# Patient Record
Sex: Female | Born: 1990 | Race: White | Hispanic: No | Marital: Single | Smoking: Never smoker
Health system: Southern US, Community
[De-identification: ages and names within clinical notes are randomized; demographics above are authoritative.]

## PROBLEM LIST (undated history)

## (undated) ENCOUNTER — Emergency Department: Discharge status: Absent without leave | Payer: Self-pay

## (undated) DIAGNOSIS — N6452 Nipple discharge: Secondary | ICD-10-CM

## (undated) HISTORY — PX: WISDOM TOOTH EXTRACTION: SHX21

---
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
## 2010-06-05 ENCOUNTER — Emergency Department: Payer: Self-pay | Admitting: Emergency Medicine

## 2010-12-28 ENCOUNTER — Emergency Department: Payer: Self-pay

## 2011-01-02 ENCOUNTER — Emergency Department: Payer: Self-pay | Admitting: Emergency Medicine

## 2011-07-22 ENCOUNTER — Emergency Department: Payer: Self-pay | Admitting: Emergency Medicine

## 2011-07-22 LAB — URINALYSIS, COMPLETE
Bilirubin,UR: NEGATIVE
Glucose,UR: NEGATIVE mg/dL (ref 0–75)
Leukocyte Esterase: NEGATIVE
Ph: 5 (ref 4.5–8.0)
Protein: NEGATIVE
RBC,UR: <1 /HPF (ref 0–5)
Specific Gravity: 1.021 (ref 1.003–1.030)
WBC UR: 1 /HPF (ref 0–5)

## 2011-07-27 ENCOUNTER — Emergency Department: Payer: Self-pay | Admitting: Unknown Physician Specialty

## 2011-07-27 LAB — CBC
HCT: 37 % (ref 35.0–47.0)
HGB: 12.4 g/dL (ref 12.0–16.0)
MCH: 30.7 pg (ref 26.0–34.0)
MCHC: 33.4 g/dL (ref 32.0–36.0)
Platelet: 175 10*3/uL (ref 150–440)
RDW: 12.7 % (ref 11.5–14.5)
WBC: 5.1 10*3/uL (ref 3.6–11.0)

## 2012-10-24 ENCOUNTER — Emergency Department: Payer: Self-pay | Admitting: Internal Medicine

## 2015-06-03 ENCOUNTER — Emergency Department: Payer: Managed Care, Other (non HMO)

## 2015-06-03 ENCOUNTER — Emergency Department
Admission: EM | Admit: 2015-06-03 | Discharge: 2015-06-03 | Disposition: A | Discharge status: Home / Self Care | Payer: Managed Care, Other (non HMO) | Attending: Emergency Medicine | Admitting: Emergency Medicine

## 2015-06-03 DIAGNOSIS — T17228A Food in pharynx causing other injury, initial encounter: Secondary | ICD-10-CM | POA: Diagnosis present

## 2015-06-03 DIAGNOSIS — Y929 Unspecified place or not applicable: Secondary | ICD-10-CM | POA: Diagnosis not present

## 2015-06-03 DIAGNOSIS — K2289 Other specified disease of esophagus: Secondary | ICD-10-CM

## 2015-06-03 DIAGNOSIS — X58XXXA Exposure to other specified factors, initial encounter: Secondary | ICD-10-CM | POA: Insufficient documentation

## 2015-06-03 DIAGNOSIS — Y9389 Activity, other specified: Secondary | ICD-10-CM | POA: Insufficient documentation

## 2015-06-03 DIAGNOSIS — K228 Other specified diseases of esophagus: Secondary | ICD-10-CM

## 2015-06-03 DIAGNOSIS — Y999 Unspecified external cause status: Secondary | ICD-10-CM | POA: Insufficient documentation

## 2015-06-03 MED ORDER — LIDOCAINE VISCOUS 2 % MT SOLN
15.0000 mL | Freq: Once | OROMUCOSAL | Status: DC
  Filled 2015-06-03: qty 15

## 2015-06-03 MED ORDER — LIDOCAINE VISCOUS 2 % MT SOLN: 20.0000 mL | OROMUCOSAL | Status: AC | PRN

## 2015-06-03 NOTE — ED Notes (Signed)
Pt was eating potatoes and choked prior to arrival, pt states she feels like she has to cough.  Is able to drink fluids without difficulty, and speak without difficulty but feel like food is stuck. Pt states she was able to swallow potato and continue to eat.

## 2015-06-03 NOTE — ED Provider Notes (Signed)
[facility] Emergency Department Provider Note  ____________________________________________  Time seen: Approximately 8:25 PM  I have reviewed the triage vital signs and the nursing notes.   HISTORY  Chief Complaint Foreign Body    HPI Brittney Lynch is a 25 y.o. female presents for evaluation of having a foreign body sensation in her throat. Patient states that she was eating potatoes proximal thigh 30 minutes prior to arrival and choked and now feels like something stuck in her throat. Patient ports that she is able to breathe and drink fluids without difficulty she speaks but hurts to talk.   No past medical history on file.  There are no active problems to display for this patient.   No past surgical history on file.  Current Outpatient Rx  Name  Route  Sig  Dispense  Refill  . lidocaine (XYLOCAINE) 2 % solution   Mouth/Throat   Use as directed 20 mLs in the mouth or throat as needed for mouth pain.   100 mL   0     Allergies Review of patient's allergies indicates no known allergies.  No family history on file.  Social History Social History  Substance Use Topics  . Smoking status: Not on file  . Smokeless tobacco: Not on file  . Alcohol Use: Not on file    Review of Systems Constitutional: No fever/chills ENT: Positive sore throat with foreign body sensation. Cardiovascular: Denies chest pain. Respiratory: Denies shortness of breath. Musculoskeletal: Negative for back pain. Skin: Negative for rash. Neurological: Negative for headaches, focal weakness or numbness.  10-point ROS otherwise negative.  ____________________________________________   PHYSICAL EXAM:  VITAL SIGNS: ED Triage Vitals  Enc Vitals Group     BP 06/03/15 2006 112/76 mmHg     Pulse Rate 06/03/15 2006 77     Resp 06/03/15 2006 18     Temp 06/03/15 2006 98.3 F (36.8 C)     Temp Source 06/03/15 2006 Oral     SpO2 06/03/15 2006 100 %     Weight  06/03/15 2006 140 lb (63.504 kg)     Height 06/03/15 2006  (1.651 m)     Head Cir --      Peak Flow --      Pain Score --      Pain Loc --      Pain Edu? --      Excl. in GC? --     Constitutional: Alert and oriented. Well appearing and in no acute distress. Head: Atraumatic. Nose: No congestion/rhinnorhea. Mouth/Throat: Mucous membranes are moist.  Oropharynx non-erythematous. Neck: No stridor.   Cardiovascular: Normal rate, regular rhythm. Grossly normal heart sounds.  Good peripheral circulation. Respiratory: Normal respiratory effort.  No retractions. Lungs CTAB. Musculoskeletal: No lower extremity tenderness nor edema.  No joint effusions. Neurologic:  Normal speech and language. No gross focal neurologic deficits are appreciated. No gait instability. Psychiatric: Mood and affect are normal. Speech and behavior are normal.  ____________________________________________   LABS (all labs ordered are listed, but only abnormal results are displayed)  Labs Reviewed - No data to display  RADIOLOGY  FINDINGS: There is no evidence of retropharyngeal soft tissue swelling or epiglottic enlargement. The cervical airway is unremarkable and no radio-opaque foreign body identified.  IMPRESSION: No acute abnormality noted. ____________________________________________   PROCEDURES  Procedure(s) performed: None  Critical Care performed: No  ____________________________________________   INITIAL IMPRESSION / ASSESSMENT AND PLAN / ED COURSE  Pertinent labs & imaging results  that were available during my care of the patient were reviewed by me and considered in my medical decision making (see chart for details).  Acute esophageal irritation. Reassurance provided to the patient. Patient provided with viscous lidocaine 15 mL Devoria GlassingXuan ED discharged home with prescription for same. Follow-up with PCP or return to ER with any worsening symptomology. Patient voices no other  emergency medical complaints at this time. ____________________________________________   FINAL CLINICAL IMPRESSION(S) / ED DIAGNOSES  Final diagnoses:  Esophageal pain     This chart was dictated using voice recognition software/Dragon. Despite best efforts to proofread, errors can occur which can change the meaning. Any change was purely unintentional.   Evangeline Dakinharles M Adrena Nakamura, PA-C 06/03/15 2102  Richardean Canalavid H Yao, MD 06/03/15 [phone]

## 2015-06-03 NOTE — ED Notes (Signed)
Assessed per PA 

## 2015-07-31 ENCOUNTER — Encounter: Payer: Self-pay | Admitting: Emergency Medicine

## 2015-07-31 ENCOUNTER — Emergency Department
Admission: EM | Admit: 2015-07-31 | Discharge: 2015-07-31 | Disposition: A | Discharge status: Home / Self Care | Payer: Managed Care, Other (non HMO) | Attending: Emergency Medicine | Admitting: Emergency Medicine

## 2015-07-31 DIAGNOSIS — J039 Acute tonsillitis, unspecified: Secondary | ICD-10-CM | POA: Diagnosis not present

## 2015-07-31 DIAGNOSIS — J029 Acute pharyngitis, unspecified: Secondary | ICD-10-CM | POA: Diagnosis present

## 2015-07-31 LAB — MONONUCLEOSIS SCREEN: Mono Screen: NEGATIVE

## 2015-07-31 LAB — POCT RAPID STREP A: STREPTOCOCCUS, GROUP A SCREEN (DIRECT): NEGATIVE

## 2015-07-31 MED ORDER — AMOXICILLIN 875 MG PO TABS: 875.0000 mg | ORAL_TABLET | Freq: Two times a day (BID) | ORAL | Status: DC

## 2015-07-31 MED ORDER — FIRST-DUKES MOUTHWASH MT SUSP: 5.0000 mL | Freq: Four times a day (QID) | OROMUCOSAL | Status: AC | PRN

## 2015-07-31 NOTE — Discharge Instructions (Signed)

## 2015-07-31 NOTE — ED Notes (Signed)
Sore throat x 2 days

## 2015-07-31 NOTE — ED Provider Notes (Signed)
[facility] Emergency Department Provider Note  ____________________________________________  Time seen: Approximately 10:14 AM  I have reviewed the triage vital signs and the nursing notes.   HISTORY  Chief Complaint Sore Throat    HPI Brittney Lynch is a 25 y.o. female , NAD, presents to the emergency department, accompanied by her mother, with 2 day history of sore throat. States has had a sore throat for several days that felt like onset of allergy symptoms. Over the last 2 days has had increased and sore throat and has noted some swelling about the right tonsil. Had a low-grade temperature of 58F earlier this morning. Has had some body aches today. Also notes fatigue over the last few days. Has not taken anything over-the-counter for current symptoms. Denies any abdominal pain, nausea, vomiting, ear pain, sinus pressure, nasal congestion, runny nose. No known sick contacts.   History reviewed. No pertinent past medical history.  There are no active problems to display for this patient.   Past Surgical History  Procedure Laterality Date  . Wisdom tooth extraction      Current Outpatient Rx  Name  Route  Sig  Dispense  Refill  . amoxicillin (AMOXIL) 875 MG tablet   Oral   Take 1 tablet (875 mg total) by mouth 2 (two) times daily.   20 tablet   0   . Diphenhyd-Hydrocort-Nystatin (FIRST-DUKES MOUTHWASH) SUSP   Mouth/Throat   Use as directed 5 mLs in the mouth or throat 4 (four) times daily as needed (for throat pain).   120 mL   0   . lidocaine (XYLOCAINE) 2 % solution   Mouth/Throat   Use as directed 20 mLs in the mouth or throat as needed for mouth pain.   100 mL   0     Allergies Review of patient's allergies indicates no known allergies.  No family history on file.  Social History Social History  Substance Use Topics  . Smoking status: Never Smoker   . Smokeless tobacco: None  . Alcohol Use: No     Review of Systems   Constitutional: Positive fatigue. No fever/chills Eyes: No visual changes. No discharge, redness, pain, swelling. ENT: Positive sore throat and swelling about tonsils. No nasal congestion, runny nose, ear pain. Cardiovascular: No chest pain. Respiratory: No cough. No shortness of breath. No wheezing.  Gastrointestinal: No abdominal pain.  No nausea, vomiting.  No diarrhea.  Musculoskeletal: Positive general myalgias.  Skin: Negative for rash. Neurological: Positive lightheadedness this morning that has resolved. Negative for headaches, focal weakness or numbness. No tingling.  10-point ROS otherwise negative.  ____________________________________________   PHYSICAL EXAM:  VITAL SIGNS: ED Triage Vitals  Enc Vitals Group     BP 07/31/15 1004 103/75 mmHg     Pulse Rate 07/31/15 1004 80     Resp 07/31/15 1004 20     Temp 07/31/15 1004 98.2 F (36.8 C)     Temp src --      SpO2 07/31/15 1004 100 %     Weight 07/31/15 1004 145 lb (65.772 kg)     Height 07/31/15 1004  (1.626 m)     Head Cir --      Peak Flow --      Pain Score 07/31/15 1005 8     Pain Loc --      Pain Edu? --      Excl. in GC? --      Constitutional: Alert and oriented. Well appearing and  in no acute distress. Eyes: Conjunctivae are normal.  Head: Atraumatic. ENT:      Ears: TMs visualized bilaterally without effusion, bulging, perforation, erythema.      Nose: No congestion/rhinnorhea.      Mouth/Throat: Right tonsil with mild swelling and white exudate. Trace erythema about the right tonsil. Uvula is midline. Posterior pharynx without swelling or erythema. Mucous membranes are moist.  Neck: Supple with full range of motion. Hematological/Lymphatic/Immunilogical: Positive right, anterior cervical lymphadenopathy. Cardiovascular: Normal rate, regular rhythm. Normal S1 and S2.   Respiratory: Normal respiratory effort without tachypnea or retractions. Lungs CTAB with breath sounds noted in all lung  fields Neurologic:  Normal speech and language. No gross focal neurologic deficits are appreciated.  Skin:  Skin is warm, dry and intact. No rash noted. Psychiatric: Mood and affect are normal. Speech and behavior are normal. Patient exhibits appropriate insight and judgement.   ____________________________________________   LABS (all labs ordered are listed, but only abnormal results are displayed)  Labs Reviewed  CULTURE, GROUP A STREP [facility])  MONONUCLEOSIS SCREEN  POCT RAPID STREP A   ____________________________________________  EKG  None ____________________________________________  RADIOLOGY  None ____________________________________________    PROCEDURES  Procedure(s) performed: None    Medications - No data to display   ____________________________________________   INITIAL IMPRESSION / ASSESSMENT AND PLAN / ED COURSE  Pertinent lab results that were available during my care of the patient were reviewed by me and considered in my medical decision making (see chart for details).  Patient's diagnosis is consistent with tonsillitis. Patient will be discharged home with prescriptions for amoxicillin and magic mouthwash to use as directed. May take Tylenol or Motrin as needed for fever or aches. Patient is to follow up with her PCP if symptoms persist past this treatment course or no better in 2-3 days. Patient is given ED precautions to return to the ED for any worsening or new symptoms.    ____________________________________________  FINAL CLINICAL IMPRESSION(S) / ED DIAGNOSES  Final diagnoses:  Tonsillitis      NEW MEDICATIONS STARTED DURING THIS VISIT:  New Prescriptions   AMOXICILLIN (AMOXIL) 875 MG TABLET    Take 1 tablet (875 mg total) by mouth 2 (two) times daily.   DIPHENHYD-HYDROCORT-NYSTATIN (FIRST-DUKES MOUTHWASH) SUSP    Use as directed 5 mLs in the mouth or throat 4 (four) times daily as needed (for throat pain).         Hope PigeonJami L  Sharee Sturdy, PA-C 07/31/15 1126  Sharyn CreamerMark Quale, MD 07/31/15 1525

## 2015-08-02 LAB — CULTURE, GROUP A STREP (THRC)

## 2017-09-26 ENCOUNTER — Other Ambulatory Visit: Payer: Self-pay | Admitting: Internal Medicine

## 2017-10-03 ENCOUNTER — Other Ambulatory Visit: Payer: Self-pay | Admitting: Internal Medicine

## 2017-10-03 DIAGNOSIS — N643 Galactorrhea not associated with childbirth: Secondary | ICD-10-CM

## 2017-10-10 ENCOUNTER — Ambulatory Visit
Admission: RE | Admit: 2017-10-10 | Discharge: 2017-10-10 | Disposition: A | Discharge status: Home / Self Care | Payer: Managed Care, Other (non HMO) | Source: Ambulatory Visit | Attending: Internal Medicine | Admitting: Internal Medicine

## 2017-10-10 DIAGNOSIS — N643 Galactorrhea not associated with childbirth: Secondary | ICD-10-CM | POA: Insufficient documentation

## 2017-10-10 HISTORY — DX: Nipple discharge: N64.52

## 2018-09-22 ENCOUNTER — Telehealth: Payer: Self-pay

## 2018-09-22 DIAGNOSIS — Z20822 Contact with and (suspected) exposure to covid-19: Secondary | ICD-10-CM

## 2018-09-22 NOTE — Telephone Encounter (Signed)
Pt potential work exposure. Pt denies symptoms but wants to be screened. Order placed for testing. Address given for [facility] testing site. Advised pt to stay in car and to wear mask to testing location. Pt verbalized understanding.

## 2018-09-23 ENCOUNTER — Other Ambulatory Visit: Payer: Self-pay

## 2018-09-23 DIAGNOSIS — Z20822 Contact with and (suspected) exposure to covid-19: Secondary | ICD-10-CM

## 2018-09-24 LAB — NOVEL CORONAVIRUS, NAA: SARS-CoV-2, NAA: NOT DETECTED

## 2018-09-25 ENCOUNTER — Telehealth: Payer: Self-pay | Admitting: General Practice

## 2018-09-25 NOTE — Telephone Encounter (Signed)
Negative COVID results given. Patient results "NOT Detected." Caller expressed understanding. ° °

## 2019-03-25 ENCOUNTER — Ambulatory Visit: Payer: BC Managed Care – PPO | Attending: Internal Medicine

## 2019-03-25 DIAGNOSIS — Z20822 Contact with and (suspected) exposure to covid-19: Secondary | ICD-10-CM | POA: Insufficient documentation

## 2019-03-26 LAB — NOVEL CORONAVIRUS, NAA: SARS-CoV-2, NAA: NOT DETECTED

## 2019-10-23 ENCOUNTER — Other Ambulatory Visit: Payer: Self-pay | Admitting: Internal Medicine

## 2019-10-23 DIAGNOSIS — N6310 Unspecified lump in the right breast, unspecified quadrant: Secondary | ICD-10-CM

## 2019-11-05 ENCOUNTER — Ambulatory Visit
Admission: RE | Admit: 2019-11-05 | Discharge: 2019-11-05 | Disposition: A | Discharge status: Home / Self Care | Payer: BC Managed Care – PPO | Source: Ambulatory Visit | Attending: Internal Medicine | Admitting: Internal Medicine

## 2019-11-05 ENCOUNTER — Other Ambulatory Visit: Payer: Self-pay

## 2019-11-05 DIAGNOSIS — N6310 Unspecified lump in the right breast, unspecified quadrant: Secondary | ICD-10-CM | POA: Diagnosis not present

## 2020-02-14 ENCOUNTER — Other Ambulatory Visit: Payer: Self-pay

## 2020-02-14 ENCOUNTER — Emergency Department
Admission: EM | Admit: 2020-02-14 | Discharge: 2020-02-14 | Disposition: A | Discharge status: Home / Self Care | Payer: BC Managed Care – PPO | Attending: Emergency Medicine | Admitting: Emergency Medicine

## 2020-02-14 DIAGNOSIS — T3695XA Adverse effect of unspecified systemic antibiotic, initial encounter: Secondary | ICD-10-CM | POA: Insufficient documentation

## 2020-02-14 DIAGNOSIS — R110 Nausea: Secondary | ICD-10-CM | POA: Diagnosis not present

## 2020-02-14 DIAGNOSIS — R1013 Epigastric pain: Secondary | ICD-10-CM | POA: Insufficient documentation

## 2020-02-14 DIAGNOSIS — T50905A Adverse effect of unspecified drugs, medicaments and biological substances, initial encounter: Secondary | ICD-10-CM

## 2020-02-14 DIAGNOSIS — N3000 Acute cystitis without hematuria: Secondary | ICD-10-CM | POA: Diagnosis not present

## 2020-02-14 LAB — COMPREHENSIVE METABOLIC PANEL
ALT: 18 U/L (ref 0–44)
AST: 21 U/L (ref 15–41)
Albumin: 3.9 g/dL (ref 3.5–5.0)
Alkaline Phosphatase: 45 U/L (ref 38–126)
Anion gap: 9 (ref 5–15)
BUN: 13 mg/dL (ref 6–20)
CO2: 25 mmol/L (ref 22–32)
Calcium: 9 mg/dL (ref 8.9–10.3)
Chloride: 104 mmol/L (ref 98–111)
Creatinine, Ser: 0.43 mg/dL — ABNORMAL LOW (ref 0.44–1.00)
GFR, Estimated: >60 mL/min (ref >60)
Glucose, Bld: 110 mg/dL — ABNORMAL HIGH (ref 70–99)
Potassium: 3.7 mmol/L (ref 3.5–5.1)
Sodium: 138 mmol/L (ref 135–145)
Total Bilirubin: 0.7 mg/dL (ref 0.3–1.2)
Total Protein: 7.2 g/dL (ref 6.5–8.1)

## 2020-02-14 LAB — CBC
HCT: 39.4 % (ref 36.0–46.0)
Hemoglobin: 13.4 g/dL (ref 12.0–15.0)
MCH: 31.5 pg (ref 26.0–34.0)
MCHC: 34 g/dL (ref 30.0–36.0)
MCV: 92.5 fL (ref 80.0–100.0)
Platelets: 249 10*3/uL (ref 150–400)
RBC: 4.26 MIL/uL (ref 3.87–5.11)
RDW: 12.6 % (ref 11.5–15.5)
WBC: 5.7 10*3/uL (ref 4.0–10.5)
nRBC: 0 % (ref 0.0–0.2)

## 2020-02-14 LAB — URINALYSIS, COMPLETE (UACMP) WITH MICROSCOPIC
Bacteria, UA: NONE SEEN
Bilirubin Urine: NEGATIVE
Glucose, UA: NEGATIVE mg/dL
Hgb urine dipstick: NEGATIVE
Ketones, ur: NEGATIVE mg/dL
Leukocytes,Ua: NEGATIVE
Nitrite: NEGATIVE
Protein, ur: NEGATIVE mg/dL
Specific Gravity, Urine: 1.018 (ref 1.005–1.030)
pH: 5 (ref 5.0–8.0)

## 2020-02-14 LAB — POC URINE PREG, ED: Preg Test, Ur: NEGATIVE

## 2020-02-14 LAB — LIPASE, BLOOD: Lipase: 30 U/L (ref 11–51)

## 2020-02-14 MED ORDER — AMOXICILLIN 875 MG PO TABS: 875.0000 mg | ORAL_TABLET | Freq: Two times a day (BID) | ORAL | 0 refills | Status: AC

## 2020-02-14 NOTE — ED Notes (Signed)
Pt states she has had UTI's before but it has been "a really long time, years"

## 2020-02-14 NOTE — ED Triage Notes (Signed)
Pt comes pov with upper abdominal pain. Started antibiotic for UTI yesterday (2 doses). Sharp pain radiating to the back.

## 2020-02-14 NOTE — Discharge Instructions (Signed)
Please stop taking your cefdinir and start taking amoxicillin

## 2020-02-14 NOTE — ED Provider Notes (Signed)
[facility] Emergency Department Provider Note   ____________________________________________   Event Date/Time   First MD Initiated Contact with Patient 02/14/20 1110     (approximate)  I have reviewed the triage vital signs and the nursing notes.   HISTORY  Chief Complaint Abdominal Pain    HPI Brittney Lynch is a 30 y.o. female with recently diagnosed urinary tract infection who presents for midepigastric abdominal pain that radiates through to her back and is worsened when taking her cefdinir that she was prescribed for her urinary tract infection.  Patient also describes associated nausea.  Patient denies any other exacerbating or relieving factors.  Patient describes a burning, 5/10 central abdominal pain that radiates through to her back.  Patient has not tried taking any medications for this pain.  Patient currently denies any vision changes, tinnitus, difficulty speaking, facial droop, sore throat, shortness of breath, nausea/vomiting/diarrhea, dysuria, or weakness/numbness/paresthesias in any extremity         Past Medical History:  Diagnosis Date  . Breast discharge     There are no problems to display for this patient.   Past Surgical History:  Procedure Laterality Date  . WISDOM TOOTH EXTRACTION      Prior to Admission medications   Medication Sig Start Date End Date Taking? Authorizing Provider  amoxicillin (AMOXIL) 875 MG tablet Take 1 tablet (875 mg total) by mouth 2 (two) times daily. 02/14/20   Merwyn Katos, MD  Diphenhyd-Hydrocort-Nystatin (FIRST-DUKES MOUTHWASH) SUSP Use as directed 5 mLs in the mouth or throat 4 (four) times daily as needed (for throat pain). 07/31/15   Hagler, Jami L, PA-C  lidocaine (XYLOCAINE) 2 % solution Use as directed 20 mLs in the mouth or throat as needed for mouth pain. 06/03/15   Beers, Charmayne Sheer, PA-C    Allergies Patient has no known allergies.  Family History  Problem Relation Age of  Onset  . Breast cancer Mother     Social History Social History   Tobacco Use  . Smoking status: Never Smoker  Substance Use Topics  . Alcohol use: No    Review of Systems Constitutional: No fever/chills Eyes: No visual changes. ENT: No sore throat. Cardiovascular: Denies chest pain. Respiratory: Denies shortness of breath. Gastrointestinal: Endorses abdominal pain.  Endorses nausea, no vomiting.  No diarrhea. Genitourinary: Negative for dysuria. Musculoskeletal: Negative for acute arthralgias Skin: Negative for rash. Neurological: Negative for headaches, weakness/numbness/paresthesias in any extremity Psychiatric: Negative for suicidal ideation/homicidal ideation   ____________________________________________   PHYSICAL EXAM:  VITAL SIGNS: ED Triage Vitals [02/14/20 0819]  Enc Vitals Group     BP 111/80     Pulse Rate 82     Resp 18     Temp 98.6 F (37 C)     Temp Source Oral     SpO2 100 %     Weight 160 lb (72.6 kg)     Height 5\' 4"  (1.626 m)     Head Circumference      Peak Flow      Pain Score 5     Pain Loc      Pain Edu?      Excl. in GC?    Constitutional: Alert and oriented. Well appearing and in no acute distress. Eyes: Conjunctivae are normal. PERRL. Head: Atraumatic. Nose: No congestion/rhinnorhea. Mouth/Throat: Mucous membranes are moist. Neck: No stridor Cardiovascular: Grossly normal heart sounds.  Good peripheral circulation. Respiratory: Normal respiratory effort.  No retractions. Gastrointestinal: Soft and nontender.  No distention. Musculoskeletal: No obvious deformities Neurologic:  Normal speech and language. No gross focal neurologic deficits are appreciated. Skin:  Skin is warm and dry. No rash noted. Psychiatric: Mood and affect are normal. Speech and behavior are normal.  ____________________________________________   LABS (all labs ordered are listed, but only abnormal results are displayed)  Labs Reviewed  COMPREHENSIVE  METABOLIC PANEL - Abnormal; Notable for the following components:      Result Value   Glucose, Bld 110 (*)    Creatinine, Ser 0.43 (*)    All other components within normal limits  URINALYSIS, COMPLETE (UACMP) WITH MICROSCOPIC - Abnormal; Notable for the following components:   Color, Urine YELLOW (*)    APPearance CLEAR (*)    All other components within normal limits  LIPASE, BLOOD  CBC  POC URINE PREG, ED   ____________________________________________  EKG  ED ECG REPORT I, Merwyn Katos, the attending physician, personally viewed and interpreted this ECG.  Date: 02/14/2020 EKG Time: 0822 Rate: 72 Rhythm: normal sinus rhythm QRS Axis: normal Intervals: normal ST/T Wave abnormalities: normal Narrative Interpretation: no evidence of acute ischemia   PROCEDURES  Procedure(s) performed (including Critical Care):  .1-3 Lead EKG Interpretation Performed by: Merwyn Katos, MD Authorized by: Merwyn Katos, MD     Interpretation: normal     ECG rate:  68   ECG rate assessment: normal     Rhythm: sinus rhythm     Ectopy: none     Conduction: normal       ____________________________________________   INITIAL IMPRESSION / ASSESSMENT AND PLAN / ED COURSE  As part of my medical decision making, I reviewed the following data within the electronic MEDICAL RECORD NUMBER Nursing notes reviewed and incorporated, Labs reviewed, EKG interpreted, Old chart reviewed and Notes from prior ED visits reviewed and incorporated        Patient is a 30 year old female that presents for midepigastric abdominal pain after beginning her cefdinir for recently diagnosed urinary tract infection.  Patient's lipase and transaminases as well as bilirubin normal and therefore low probability of patient having pancreatitis or other gallbladder related diseases.  Patient's vital signs stable and pain well controlled prior to discharge.  Patient was counseled on the importance of likely needing to  change her antibiotic therapy.  Patient requested amoxicillin as she states that this is worked for her in the past for urinary tract infections and has not caused any abdominal pain.  Patient was given prescription for amoxicillin and all questions were answered prior to discharge  Clinical Course as of 02/14/20 1545  Sat Feb 14, 2020  1126 Lipase: 30 [EB]    Clinical Course User Index [EB] Merwyn Katos, MD     ____________________________________________   FINAL CLINICAL IMPRESSION(S) / ED DIAGNOSES  Final diagnoses:  Medication adverse effect, initial encounter  Acute cystitis without hematuria     ED Discharge Orders         Ordered    amoxicillin (AMOXIL) 875 MG tablet  2 times daily        02/14/20 1133           Note:  This document was prepared using Dragon voice recognition software and may include unintentional dictation errors.   Merwyn Katos, MD 02/14/20 [phone]

## 2020-02-14 NOTE — ED Notes (Signed)
Pt reports on Thursday she began to have symptoms suspicious for a UTI (lower back pain, urinary discomfort), was seen at a walk in clinic on Friday. Diagnoses with UTI, prescribed Cefdinir, took first dose yesterday at approx 1400.   Pt reports at approx 0630 this AM she awakened to 5/10 central abd pain radiating to her lower back, right side slightly more than left. Pt describes pain as constant, mild relief when sitting vs standing. Has not taken anything for it so far, did not take AM dose of abx.   Pt describes pain as "constant ache, not sharp or stabbing." Some increase with bending/stretching.   Pt denies n/v/d, fevers/chills, rashes, changes to breathing, headaches

## 2020-10-26 ENCOUNTER — Emergency Department: Payer: BC Managed Care – PPO

## 2020-10-26 ENCOUNTER — Emergency Department
Admission: EM | Admit: 2020-10-26 | Discharge: 2020-10-26 | Disposition: A | Discharge status: Home / Self Care | Payer: BC Managed Care – PPO | Attending: Emergency Medicine | Admitting: Emergency Medicine

## 2020-10-26 DIAGNOSIS — M549 Dorsalgia, unspecified: Secondary | ICD-10-CM | POA: Diagnosis not present

## 2020-10-26 DIAGNOSIS — K429 Umbilical hernia without obstruction or gangrene: Secondary | ICD-10-CM | POA: Insufficient documentation

## 2020-10-26 DIAGNOSIS — R109 Unspecified abdominal pain: Secondary | ICD-10-CM | POA: Diagnosis present

## 2020-10-26 LAB — LIPASE, BLOOD: Lipase: 41 U/L (ref 11–51)

## 2020-10-26 LAB — CBC
HCT: 39.2 % (ref 36.0–46.0)
Hemoglobin: 13.7 g/dL (ref 12.0–15.0)
MCH: 32.2 pg (ref 26.0–34.0)
MCHC: 34.9 g/dL (ref 30.0–36.0)
MCV: 92 fL (ref 80.0–100.0)
Platelets: 285 10*3/uL (ref 150–400)
RBC: 4.26 MIL/uL (ref 3.87–5.11)
RDW: 12.5 % (ref 11.5–15.5)
WBC: 8 10*3/uL (ref 4.0–10.5)
nRBC: 0 % (ref 0.0–0.2)

## 2020-10-26 LAB — URINALYSIS, COMPLETE (UACMP) WITH MICROSCOPIC
Bilirubin Urine: NEGATIVE
Glucose, UA: NEGATIVE mg/dL
Ketones, ur: NEGATIVE mg/dL
Leukocytes,Ua: NEGATIVE
Nitrite: NEGATIVE
Protein, ur: NEGATIVE mg/dL
Specific Gravity, Urine: 1.002 — ABNORMAL LOW (ref 1.005–1.030)
pH: 7 (ref 5.0–8.0)

## 2020-10-26 LAB — COMPREHENSIVE METABOLIC PANEL
ALT: 22 U/L (ref 0–44)
AST: 30 U/L (ref 15–41)
Albumin: 4.4 g/dL (ref 3.5–5.0)
Alkaline Phosphatase: 52 U/L (ref 38–126)
Anion gap: 8 (ref 5–15)
BUN: 14 mg/dL (ref 6–20)
CO2: 26 mmol/L (ref 22–32)
Calcium: 9.4 mg/dL (ref 8.9–10.3)
Chloride: 103 mmol/L (ref 98–111)
Creatinine, Ser: 0.66 mg/dL (ref 0.44–1.00)
GFR, Estimated: >60 mL/min (ref >60)
Glucose, Bld: 97 mg/dL (ref 70–99)
Potassium: 3.5 mmol/L (ref 3.5–5.1)
Sodium: 137 mmol/L (ref 135–145)
Total Bilirubin: 0.6 mg/dL (ref 0.3–1.2)
Total Protein: 7.4 g/dL (ref 6.5–8.1)

## 2020-10-26 MED ORDER — CEPHALEXIN 500 MG PO CAPS: 500.0000 mg | ORAL_CAPSULE | Freq: Three times a day (TID) | ORAL | 0 refills | Status: AC

## 2020-10-26 NOTE — ED Provider Notes (Signed)
[facility] Emergency Department Provider Note  Time seen: 7:02 PM  I have reviewed the triage vital signs and the nursing notes.   HISTORY  Chief Complaint Abdominal Pain (X 1 week , left of navel constant pain )   HPI Brittney Lynch is a 30 y.o. female with no significant past medical history who presents to the emergency department for left-sided abdominal pain.  According to the patient over the past week she has been experiencing intermittent pain in the left abdomen and occasionally in the left back.  Patient denies any pain with movement.  Denies any pain with palpation.  Denies any dysuria or hematuria, vaginal bleeding or discharge.  Denies any nausea vomiting or diarrhea.  No fever.  No history of kidney stones.   Past Medical History:  Diagnosis Date   Breast discharge     There are no problems to display for this patient.   Past Surgical History:  Procedure Laterality Date   WISDOM TOOTH EXTRACTION      Prior to Admission medications   Medication Sig Start Date End Date Taking? Authorizing Provider  amoxicillin (AMOXIL) 875 MG tablet Take 1 tablet (875 mg total) by mouth 2 (two) times daily. 02/14/20   Merwyn Katos, MD  Diphenhyd-Hydrocort-Nystatin (FIRST-DUKES MOUTHWASH) SUSP Use as directed 5 mLs in the mouth or throat 4 (four) times daily as needed (for throat pain). 07/31/15   Hagler, Jami L, PA-C  lidocaine (XYLOCAINE) 2 % solution Use as directed 20 mLs in the mouth or throat as needed for mouth pain. 06/03/15   Beers, Charmayne Sheer, PA-C    No Known Allergies  Family History  Problem Relation Age of Onset   Breast cancer Mother     Social History Social History   Tobacco Use   Smoking status: Never  Substance Use Topics   Alcohol use: No    Review of Systems Constitutional: Negative for fever. Cardiovascular: Negative for chest pain. Respiratory: Negative for shortness of breath. Gastrointestinal: Moderate left flank  pain intermittent, sharp.  Negative for nausea vomiting or diarrhea Genitourinary: Negative for urinary compaints Musculoskeletal: Negative for musculoskeletal complaints Neurological: Negative for headache All other ROS negative  ____________________________________________   PHYSICAL EXAM:  VITAL SIGNS: ED Triage Vitals [10/26/20 1754]  Enc Vitals Group     BP 125/79     Pulse Rate 79     Resp 17     Temp 98.5 F (36.9 C)     Temp Source Oral     SpO2 95 %     Weight 165 lb 5.5 oz (75 kg)     Height 5\' 7"  (1.702 m)     Head Circumference      Peak Flow      Pain Score 2     Pain Loc      Pain Edu?      Excl. in GC?    Constitutional: Alert and oriented. Well appearing and in no distress. Eyes: Normal exam ENT      Head: Normocephalic and atraumatic.      Mouth/Throat: Mucous membranes are moist. Cardiovascular: Normal rate, regular rhythm.  Respiratory: Normal respiratory effort without tachypnea nor retractions. Breath sounds are clear  Gastrointestinal: Soft and nontender. No distention.  Musculoskeletal: Nontender with normal range of motion in all extremities. Neurologic:  Normal speech and language. No gross focal neurologic deficits  Skin:  Skin is warm, dry and intact.  Psychiatric: Mood and affect are normal.  ____________________________________________   RADIOLOGY  CT scan shows a very small umbilical hernia with a mild amount of cellulitis.  ____________________________________________   INITIAL IMPRESSION / ASSESSMENT AND PLAN / ED COURSE  Pertinent labs & imaging results that were available during my care of the patient were reviewed by me and considered in my medical decision making (see chart for details).   Patient presents emergency department for left flank pain intermittent over the past week or so and now with pain intermittently to the left back.  Patient's work-up is reassuring.  Benign abdominal exam.  No CVA tenderness.  Lab work is  reassuring.  Urinalysis is normal.  However given the patient's left flank pain intermittently now with back pain we will obtain CT renal scan to rule out ureterolithiasis.  Differential would also include intestinal type pains, musculoskeletal pains, colitis or diverticulitis.  CT concerning for very small umbilical hernia with a small amount of cellulitis otherwise unremarkable.  We will cover with antibiotics as a precaution.  We will have the patient follow-up with general surgery if she continues to have pain over the next 1 week.  Does not appear to be causing any bowel or intestinal issues.  Brittney Lynch was evaluated in Emergency Department on 10/26/2020 for the symptoms described in the history of present illness. She was evaluated in the context of the global COVID-19 pandemic, which necessitated consideration that the patient might be at risk for infection with the SARS-CoV-2 virus that causes COVID-19. Institutional protocols and algorithms that pertain to the evaluation of patients at risk for COVID-19 are in a state of rapid change based on information released by regulatory bodies including the CDC and federal and state organizations. These policies and algorithms were followed during the patient's care in the ED.  ____________________________________________   FINAL CLINICAL IMPRESSION(S) / ED DIAGNOSES  Umbilical hernia   [name], MD 10/26/20 2013

## 2020-10-26 NOTE — ED Triage Notes (Signed)
X 1 week , left of navel constant pain, no nausea, no vomiting no other complaints

## 2021-05-11 ENCOUNTER — Other Ambulatory Visit: Payer: Self-pay | Admitting: Family Medicine

## 2021-05-11 DIAGNOSIS — N644 Mastodynia: Secondary | ICD-10-CM

## 2021-05-11 DIAGNOSIS — M7989 Other specified soft tissue disorders: Secondary | ICD-10-CM

## 2021-05-11 DIAGNOSIS — M79622 Pain in left upper arm: Secondary | ICD-10-CM

## 2021-05-30 ENCOUNTER — Ambulatory Visit
Admission: RE | Admit: 2021-05-30 | Discharge: 2021-05-30 | Disposition: A | Discharge status: Home / Self Care | Payer: BC Managed Care – PPO | Source: Ambulatory Visit | Attending: Family Medicine | Admitting: Family Medicine

## 2021-05-30 DIAGNOSIS — M79622 Pain in left upper arm: Secondary | ICD-10-CM

## 2021-05-30 DIAGNOSIS — M7989 Other specified soft tissue disorders: Secondary | ICD-10-CM | POA: Insufficient documentation

## 2021-05-30 DIAGNOSIS — N644 Mastodynia: Secondary | ICD-10-CM

## 2021-06-01 ENCOUNTER — Other Ambulatory Visit: Payer: Self-pay | Admitting: Family Medicine

## 2021-06-01 DIAGNOSIS — N63 Unspecified lump in unspecified breast: Secondary | ICD-10-CM

## 2021-06-01 DIAGNOSIS — N6001 Solitary cyst of right breast: Secondary | ICD-10-CM

## 2021-06-01 DIAGNOSIS — R928 Other abnormal and inconclusive findings on diagnostic imaging of breast: Secondary | ICD-10-CM

## 2021-06-21 ENCOUNTER — Ambulatory Visit
Admission: RE | Admit: 2021-06-21 | Discharge: 2021-06-21 | Disposition: A | Discharge status: Home / Self Care | Payer: BC Managed Care – PPO | Source: Ambulatory Visit | Attending: Family Medicine | Admitting: Family Medicine

## 2021-06-21 ENCOUNTER — Other Ambulatory Visit: Payer: Self-pay | Admitting: Family Medicine

## 2021-06-21 DIAGNOSIS — N63 Unspecified lump in unspecified breast: Secondary | ICD-10-CM

## 2021-06-21 DIAGNOSIS — N6001 Solitary cyst of right breast: Secondary | ICD-10-CM | POA: Insufficient documentation

## 2021-06-21 DIAGNOSIS — R928 Other abnormal and inconclusive findings on diagnostic imaging of breast: Secondary | ICD-10-CM | POA: Insufficient documentation

## 2021-06-21 HISTORY — PX: BREAST BIOPSY: SHX20

## 2021-06-22 LAB — SURGICAL PATHOLOGY

## 2021-06-26 IMAGING — US US BREAST*R* LIMITED INC AXILLA
1 series · 10 of 10 positions shown · non-contrast
Comparison: None.

CLINICAL DATA: 29-year-old female with a palpable right breast
lump.

EXAM:
ULTRASOUND OF THE RIGHT BREAST

[Series 1: us breast*right* limited inc axilla · 0.07mm/px · 10 of 10 slices shown]
[im 1/10]
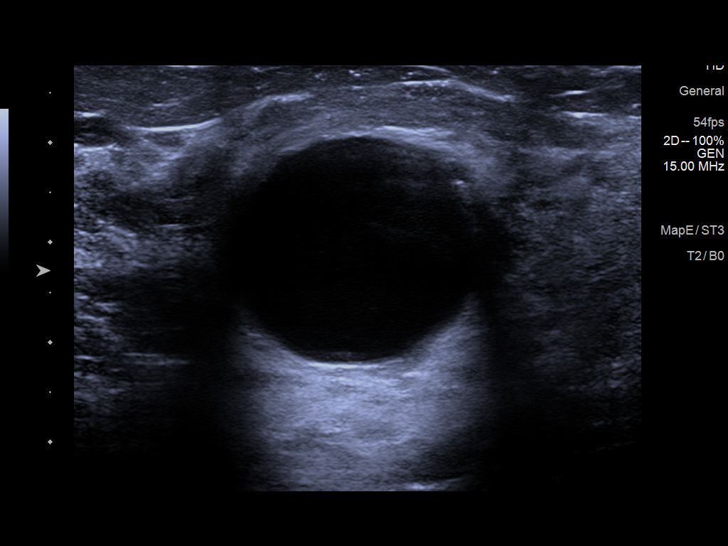
[im 2/10]
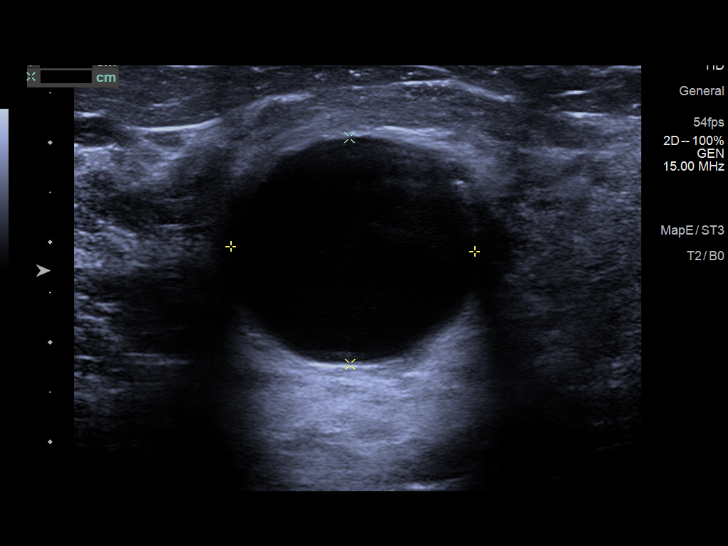
[im 3/10]
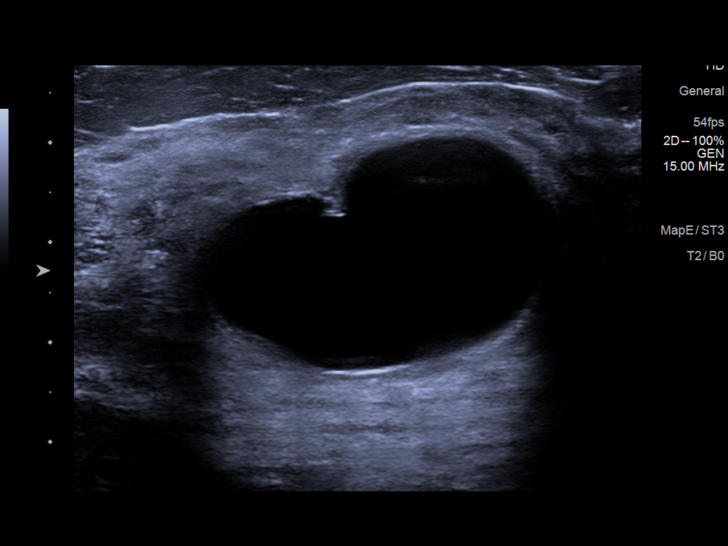
[im 4/10]
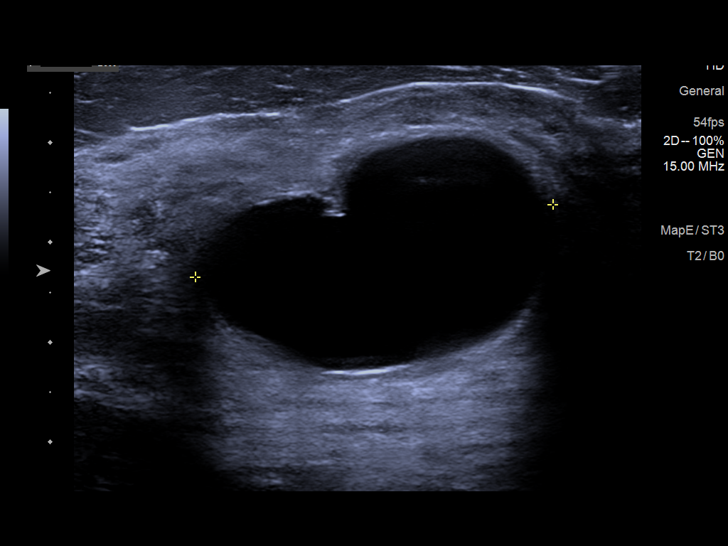
[im 5/10]
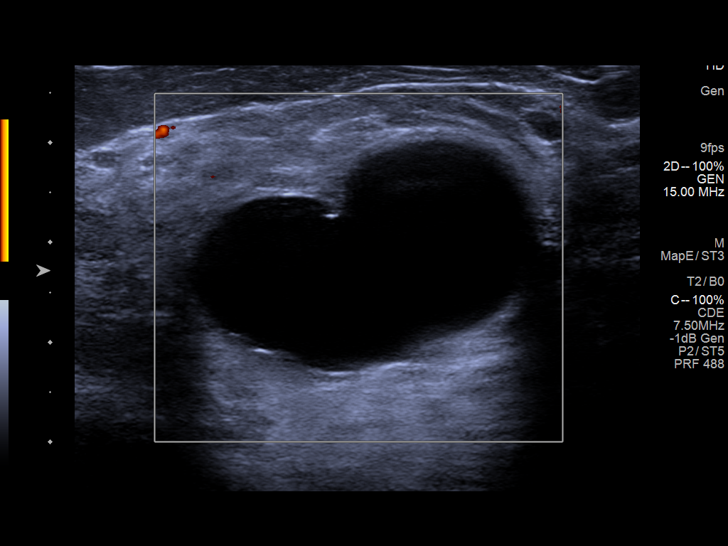
[im 6/10]
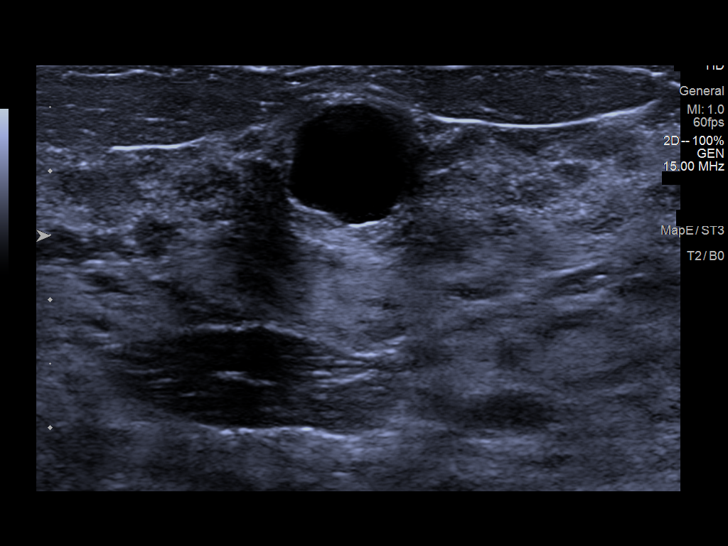
[im 7/10]
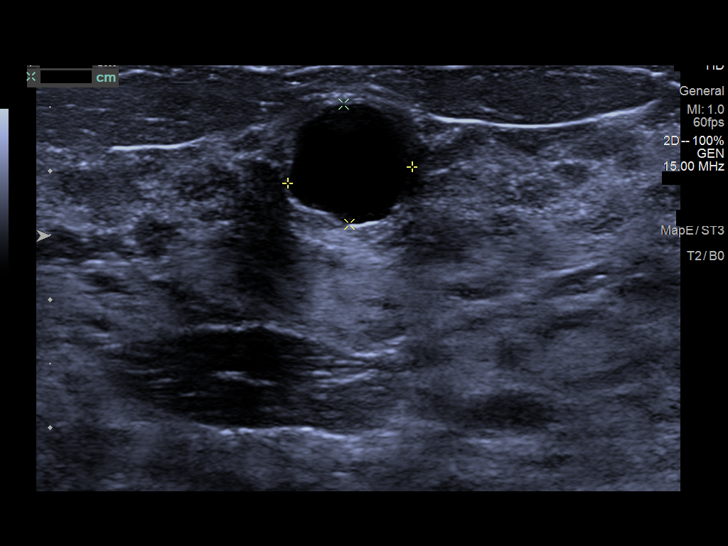
[im 8/10]
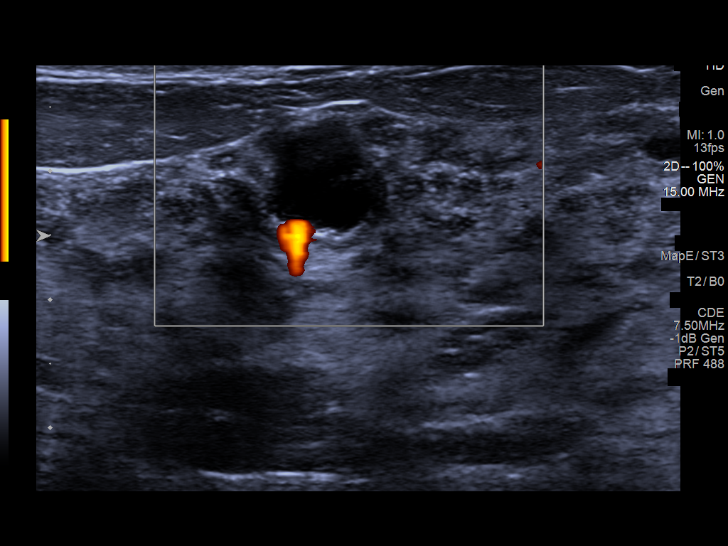
[im 9/10]
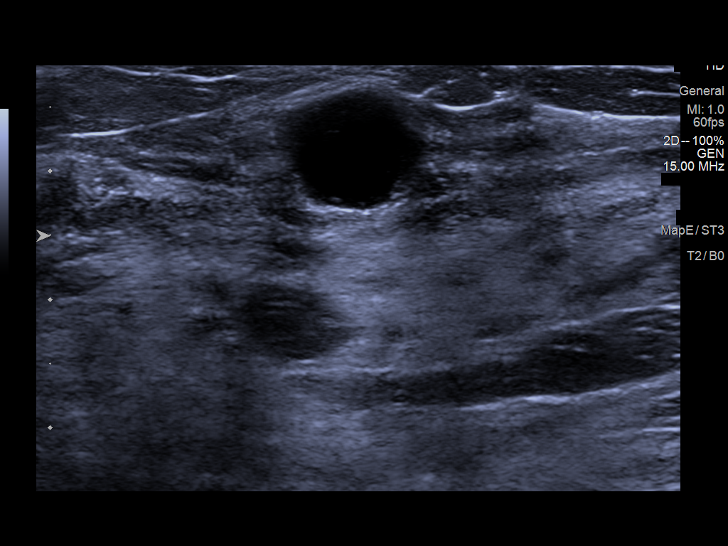
[im 10/10]
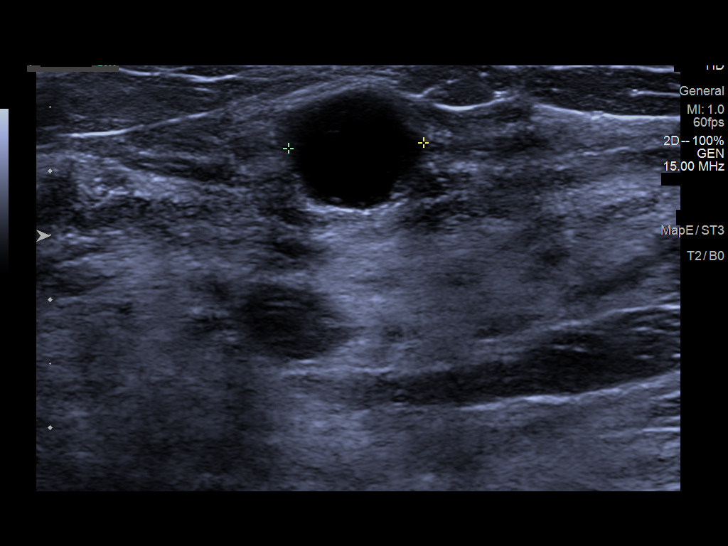

[10 of 10 positions shown; findings below may reference images not displayed]

FINDINGS: On physical exam, I palpate a smooth, mobile 2-3 cm mass in the
upper right breast.

Targeted ultrasound is performed, showing an oval, circumscribed
anechoic mass at the 12 o'clock position 4 cm from the nipple. It
demonstrates posterior acoustic enhancement and no internal
vascularity. It measures 3.7 x 2.4 x 2.3 cm. This correlates with
the patient's palpable lump and is consistent with a benign simple
cyst.

A similar appearing simple cyst is noted at the 11 o'clock position
2 cm from the nipple. It measures 11 x 10 x 9 mm. Additional
smaller, similar appearing cysts are scattered throughout the right
breast.
IMPRESSION: Benign simple cyst corresponding with the patient's right breast
palpable lump. No further imaging follow-up required.

RECOMMENDATION:
Screening mammogram at age 40 unless there are persistent or
intervening clinical concerns. (Code:[id number])

I have discussed the findings and recommendations with the patient.
If applicable, a reminder letter will be sent to the patient
regarding the next appointment.

BI-RADS CATEGORY  2: Benign.

## 2023-02-10 IMAGING — MG MM BREAST LOCALIZATION CLIP
4 series · 4 of 12 positions shown · non-contrast
Comparison: Previous exam(s).

CLINICAL DATA: Status post ultrasound-guided core biopsy
intracystic nodule right breast.

EXAM:
3D DIAGNOSTIC RIGHT MAMMOGRAM POST ULTRASOUND BIOPSY

[R CC synth-2D]
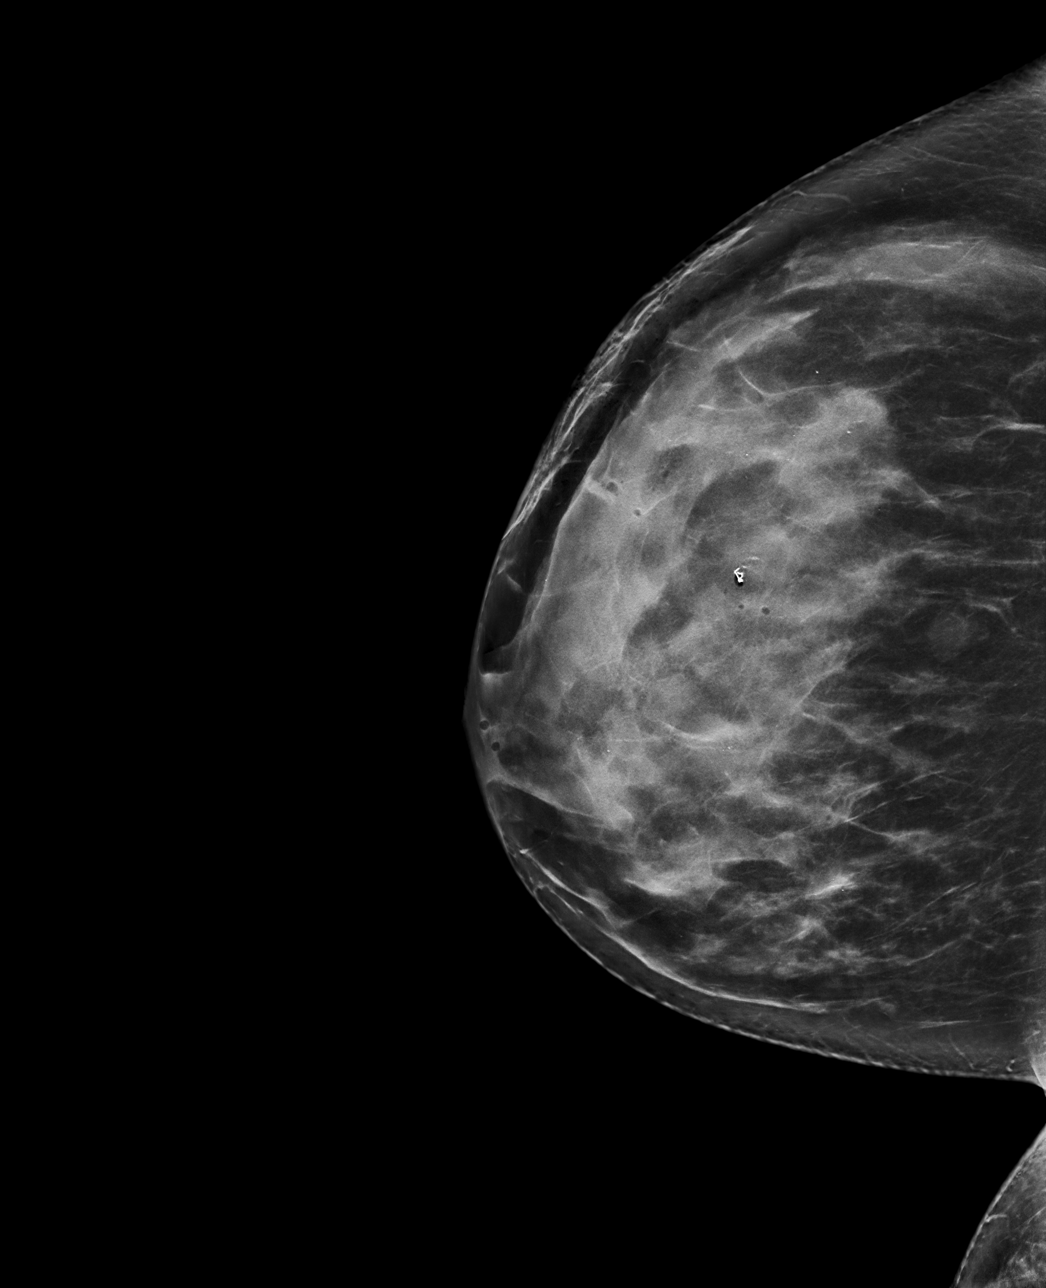

[R ML synth-2D]
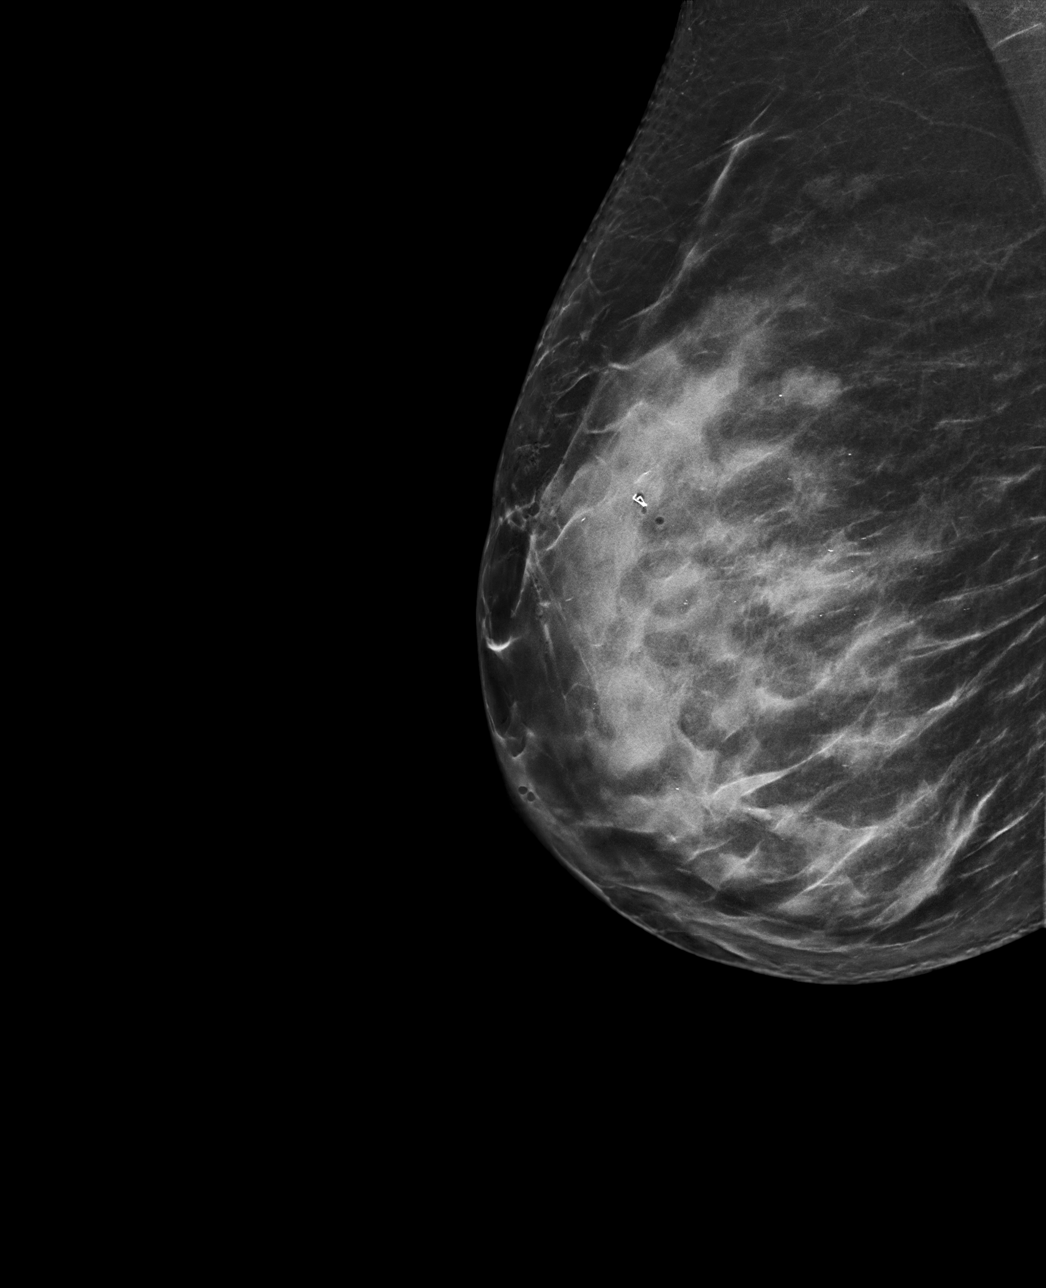

[R ML tomo · tomo slice 41/82.0]
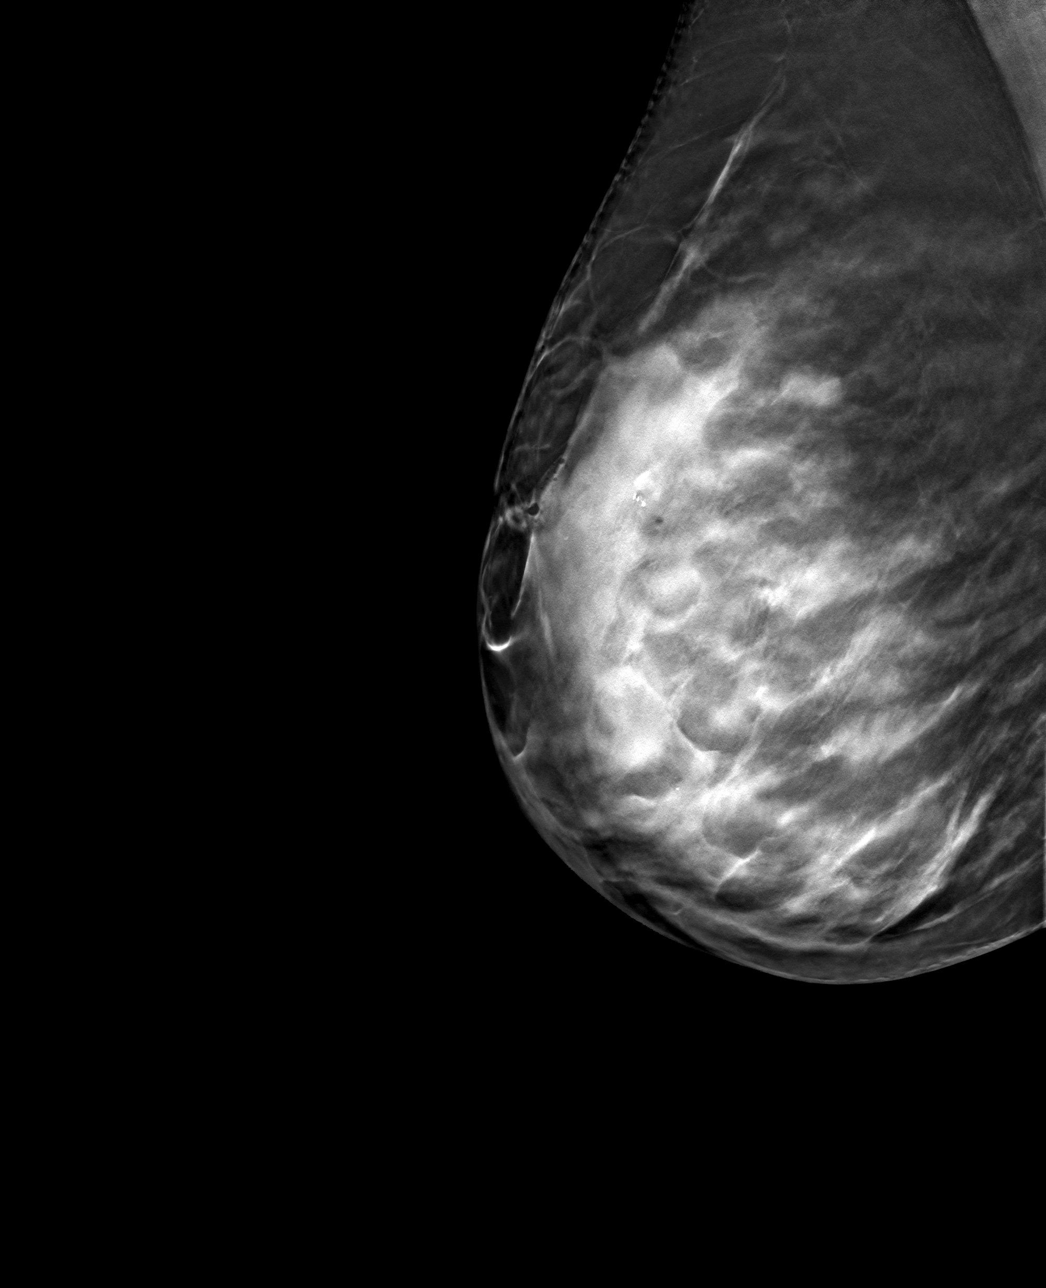

[R CC tomo · tomo slice 43/84.0]
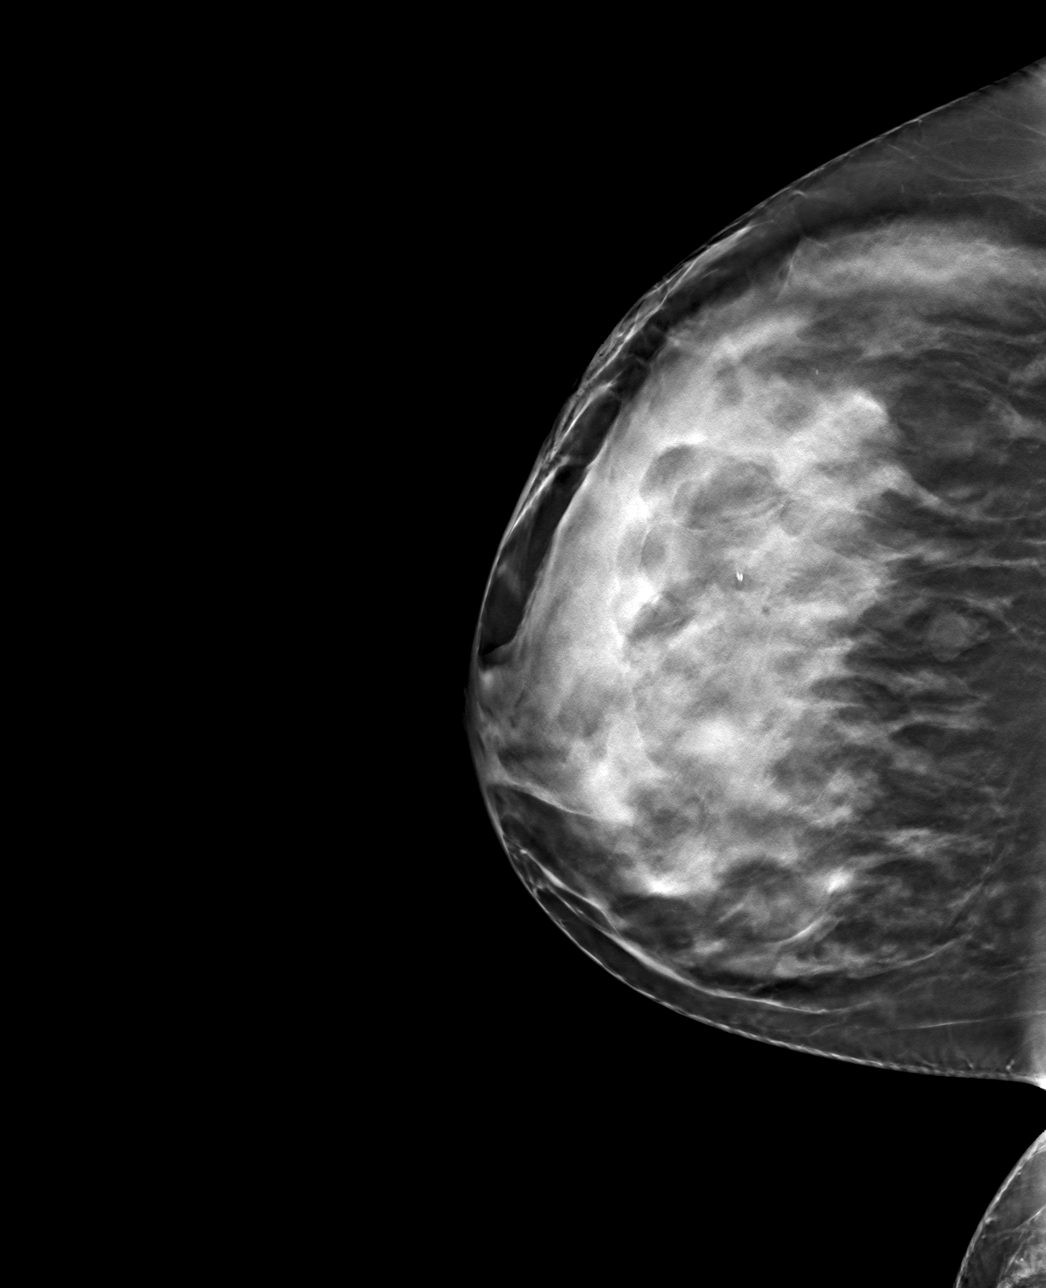

[4 of 12 positions shown; findings below may reference images not displayed]

FINDINGS: 3D Mammographic images were obtained following ultrasound guided
biopsy of intracystic nodule at the right breast 12 o'clock. The
biopsy marking clip is in expected position at the site of biopsy.
IMPRESSION: Appropriate positioning of the Widya shaped biopsy marking clip at
the site of biopsy in the expected location of concern.

Final Assessment: Post Procedure Mammograms for Marker Placement

## 2023-09-25 ENCOUNTER — Other Ambulatory Visit: Payer: Self-pay

## 2023-09-25 ENCOUNTER — Ambulatory Visit: Admission: EM | Admit: 2023-09-25 | Discharge: 2023-09-25 | Disposition: A | Discharge status: Home / Self Care

## 2023-09-25 DIAGNOSIS — T148XXA Other injury of unspecified body region, initial encounter: Secondary | ICD-10-CM

## 2023-09-25 MED ORDER — BACLOFEN 10 MG PO TABS: 10.0000 mg | ORAL_TABLET | Freq: Three times a day (TID) | ORAL | 0 refills | Status: DC

## 2023-09-25 MED ORDER — BACLOFEN 10 MG PO TABS: 10.0000 mg | ORAL_TABLET | Freq: Three times a day (TID) | ORAL | 0 refills | Status: AC

## 2023-09-25 MED ORDER — DICLOFENAC SODIUM 50 MG PO TBEC: 50.0000 mg | DELAYED_RELEASE_TABLET | Freq: Two times a day (BID) | ORAL | 1 refills | Status: DC

## 2023-09-25 MED ORDER — DICLOFENAC SODIUM 50 MG PO TBEC: 50.0000 mg | DELAYED_RELEASE_TABLET | Freq: Two times a day (BID) | ORAL | 1 refills | Status: AC

## 2023-09-25 NOTE — ED Triage Notes (Addendum)
 Pt is here after stumbling over a step at work that happened yesterday pt is having mild on and off pain in her right calf, pt has taken OTC meds to relieve discomfort.

## 2023-09-25 NOTE — Discharge Instructions (Addendum)
  1. Muscle strain (Primary) - AMB referral to sports medicine for follow-up evaluation of medication regimen does not improve symptoms - baclofen  (LIORESAL ) 10 MG tablet; Take 1 tablet (10 mg total) by mouth 3 (three) times daily.  Dispense: 30 each; Refill: 0 - diclofenac  (VOLTAREN ) 50 MG EC tablet; Take 1 tablet (50 mg total) by mouth 2 (two) times daily.  Dispense: 30 tablet; Refill: 1 - Continue applying ice 2-3 times a day for 10 to 15 minutes at a time. -No strenuous physical activity or excessive walking as this may exacerbate injury. -Continue to monitor symptoms for any change in severity if there is any escalation of current symptoms or development of new symptoms follow-up in ER for further evaluation and management.

## 2023-09-25 NOTE — ED Provider Notes (Signed)
 [facility]  Note:  This document was prepared using Conservation officer, historic buildings and may include unintentional dictation errors.  MRN: [id number] DOB: Sep 24, 1990  Subjective:   Brittney Lynch is a 33 y.o. female presenting for posterior leg pain to the right hamstring since yesterday.  Patient reports that she was walking down steps at work when she stepped down awkwardly and felt pain in the back of her leg.  Patient states that since that time when she is walking she can feel discomfort in the posterior leg.  Patient has taken some over-the-counter medication with minimal improvement to symptoms.  Patient denies any previous trauma or injury to the leg.  Patient denies any swelling, redness, warmth.   No current facility-administered medications for this encounter.  Current Outpatient Medications:    Clindamycin Phosphate (CLINDAMAX EX), CLINDAMYCIN PHOSPHATE 1 % EXTERNAL GEL, Disp: , Rfl:    amoxicillin  (AMOXIL ) 875 MG tablet, Take 1 tablet (875 mg total) by mouth 2 (two) times daily., Disp: 20 tablet, Rfl: 0   baclofen  (LIORESAL ) 10 MG tablet, Take 1 tablet (10 mg total) by mouth 3 (three) times daily., Disp: 30 each, Rfl: 0   cephALEXin  (KEFLEX ) 500 MG capsule, Take 1 capsule (500 mg total) by mouth 3 (three) times daily., Disp: 30 capsule, Rfl: 0   diclofenac  (VOLTAREN ) 50 MG EC tablet, Take 1 tablet (50 mg total) by mouth 2 (two) times daily., Disp: 30 tablet, Rfl: 1   Diphenhyd-Hydrocort-Nystatin (FIRST-DUKES MOUTHWASH) SUSP, Use as directed 5 mLs in the mouth or throat 4 (four) times daily as needed (for throat pain)., Disp: 120 mL, Rfl: 0   lidocaine  (XYLOCAINE ) 2 % solution, Use as directed 20 mLs in the mouth or throat as needed for mouth pain., Disp: 100 mL, Rfl: 0   Allergies  Allergen Reactions   Cephalexin  Dermatitis and Other (See Comments)    Past Medical History:  Diagnosis Date   Breast discharge      Past Surgical History:  Procedure  Laterality Date   BREAST BIOPSY Right 06/21/2021   US  core bx 1200 venus clip- path pending   WISDOM TOOTH EXTRACTION      Family History  Problem Relation Age of Onset   Breast cancer Mother     Social History   Tobacco Use   Smoking status: Never  Substance Use Topics   Alcohol use: No    ROS Refer to HPI for ROS details.  Objective:   Vitals: BP 119/86 (BP Location: Right Arm)   Pulse 61   Temp 98.3 F (36.8 C) (Oral)   Resp 17   LMP 08/17/2023 (Exact Date)   SpO2 97%   Physical Exam Vitals and nursing note reviewed.  Constitutional:      General: She is not in acute distress.    Appearance: She is well-developed. She is not ill-appearing or toxic-appearing.  HENT:     Head: Normocephalic and atraumatic.  Cardiovascular:     Rate and Rhythm: Normal rate.  Pulmonary:     Effort: Pulmonary effort is normal. No respiratory distress.  Musculoskeletal:        General: Normal range of motion.     Right upper leg: Tenderness present. No swelling, edema, deformity or bony tenderness.  Skin:    General: Skin is warm and dry.  Neurological:     General: No focal deficit present.     Mental Status: She is alert and oriented to person, place, and time.  Psychiatric:  Mood and Affect: Mood normal.        Behavior: Behavior normal.     Procedures  No results found for this or any previous visit (from the past 24 hours).  Assessment and Plan :     Discharge Instructions       1. Muscle strain (Primary) - AMB referral to sports medicine for follow-up evaluation of medication regimen does not improve symptoms - baclofen  (LIORESAL ) 10 MG tablet; Take 1 tablet (10 mg total) by mouth 3 (three) times daily.  Dispense: 30 each; Refill: 0 - diclofenac  (VOLTAREN ) 50 MG EC tablet; Take 1 tablet (50 mg total) by mouth 2 (two) times daily.  Dispense: 30 tablet; Refill: 1 - Continue applying ice 2-3 times a day for 10 to 15 minutes at a time. -No strenuous  physical activity or excessive walking as this may exacerbate injury. -Continue to monitor symptoms for any change in severity if there is any escalation of current symptoms or development of new symptoms follow-up in ER for further evaluation and management.      Jadrien Narine B [place]ifer   Talyia Allende, [place], [place] 09/25/23 1925
